# Patient Record
Sex: Female | Born: 1965 | Race: White | Hispanic: No | Smoking: Never smoker
Health system: Southern US, Community
[De-identification: ages and names within clinical notes are randomized; demographics above are authoritative.]

## PROBLEM LIST (undated history)

## (undated) DIAGNOSIS — Z8249 Family history of ischemic heart disease and other diseases of the circulatory system: Secondary | ICD-10-CM

## (undated) DIAGNOSIS — K449 Diaphragmatic hernia without obstruction or gangrene: Secondary | ICD-10-CM

## (undated) DIAGNOSIS — E039 Hypothyroidism, unspecified: Secondary | ICD-10-CM

## (undated) DIAGNOSIS — Z6837 Body mass index (BMI) 37.0-37.9, adult: Secondary | ICD-10-CM

## (undated) DIAGNOSIS — R635 Abnormal weight gain: Secondary | ICD-10-CM

## (undated) DIAGNOSIS — K219 Gastro-esophageal reflux disease without esophagitis: Secondary | ICD-10-CM

## (undated) DIAGNOSIS — I10 Essential (primary) hypertension: Secondary | ICD-10-CM

## (undated) HISTORY — DX: Abnormal weight gain: R63.5

## (undated) HISTORY — DX: Gastro-esophageal reflux disease without esophagitis: K21.9

## (undated) HISTORY — DX: Essential (primary) hypertension: I10

## (undated) HISTORY — DX: Hypothyroidism, unspecified: E03.9

## (undated) HISTORY — DX: Body mass index (BMI) 37.0-37.9, adult: Z68.37

## (undated) HISTORY — PX: NO PAST SURGERIES: SHX2092

## (undated) HISTORY — DX: Family history of ischemic heart disease and other diseases of the circulatory system: Z82.49

## (undated) HISTORY — DX: Gastro-esophageal reflux disease without esophagitis: K44.9

---

## 2019-09-19 DIAGNOSIS — R635 Abnormal weight gain: Secondary | ICD-10-CM | POA: Insufficient documentation

## 2019-09-19 DIAGNOSIS — I10 Essential (primary) hypertension: Secondary | ICD-10-CM | POA: Insufficient documentation

## 2019-09-19 DIAGNOSIS — K219 Gastro-esophageal reflux disease without esophagitis: Secondary | ICD-10-CM | POA: Insufficient documentation

## 2019-09-19 DIAGNOSIS — Z8249 Family history of ischemic heart disease and other diseases of the circulatory system: Secondary | ICD-10-CM | POA: Insufficient documentation

## 2019-09-19 DIAGNOSIS — E039 Hypothyroidism, unspecified: Secondary | ICD-10-CM | POA: Insufficient documentation

## 2019-09-19 DIAGNOSIS — Z6837 Body mass index (BMI) 37.0-37.9, adult: Secondary | ICD-10-CM | POA: Insufficient documentation

## 2019-09-24 NOTE — Progress Notes (Signed)
Cardiology Office Note:    Date:  09/25/2019   ID:  Melody Phillips, DOB 1965/10/04, MRN [id number]  PCP:  Buckner Malta, MD  Cardiologist:  Norman Herrlich, MD   Referring MD: Buckner Malta, MD  ASSESSMENT:    1. Cardiac risk counseling   2. Family history of heart disease   3. Essential hypertension    PLAN:    In order of problems listed above:  1. Although her 10-year risk is low and does not factor in family history, we decided further evaluation with a coronary CT calcium score if intermediate would justify statins and echocardiogram to exclude cardial myopathy with family history of sudden cardiac death in her mother's family. 2. Continue current antihypertensives.  Next appointment as needed if her calcium score is low and echo is normal   Medication Adjustments/Labs and Tests Ordered: Current medicines are reviewed at length with the patient today.  Concerns regarding medicines are outlined above.  Orders Placed This Encounter  Procedures  . CT CARDIAC SCORING  . EKG 12-Lead  . ECHOCARDIOGRAM COMPLETE   No orders of the defined types were placed in this encounter.    Chief Complaint  Patient presents with  . Heart Problem    Concerned about risk of heart disease with family history.    History of Present Illness:    Melody Phillips is a 54 y.o. female who is being seen today for the evaluation due to family history of CAD at the request of Buckner Malta, MD. She has a history of hypertension hypothyroidism.  Recent labs performed 07/31/2019 shows a hemoglobin of 12.6 CBC normal creatinine 0.7 GFR greater than 60 cc m3.9 remainder of CMP was normal free T4 normal TSH normal A1c 4.7%.  Her lipid profile shows a total cholesterol 141 LDL 68 triglycerides 124 HDL 53.  I reviewed her primary care physician office record from 07/31/2019 and 09/04/2019.  She has developed hypertension controlled on a 3 drug regimen amlodipine thiazide diuretic ARB.  Her lipids are  ideal.  Non-smoker.  Her family history is noteworthy for her mother dying of sudden cardiac death in her 31s maternal aunt died unexpectedly age 10 no autopsy and maternal aunt died at age 60 sudden cardiac death.  She is concerned about her cardiovascular risk.  She has had no syncope chest pain shortness of breath but at times has palpitation and frequent isolated not severe.  Her 10-year risk is 2%.  We discussed further tools and with her family history and sudden cardiac death echocardiogram is indicated regarding cardiomyopathy and a coronary calcium score to recalculate her risk.  If her risk is high she would benefit from further evaluation and statin therapy.  In the interim I encouraged antihypertensive treatment and starting low-dose aspirin 81 mg a day Past Medical History:  Diagnosis Date  . Abnormal weight gain   . BMI 37.0-37.9, adult   . Family history of cardiovascular disease   . Hiatal hernia with GERD without esophagitis   . Hypertension   . Hypothyroidism     Past Surgical History:  Procedure Laterality Date  . NO PAST SURGERIES      Current Medications: Current Meds  Medication Sig  . acyclovir (ZOVIRAX) 400 MG tablet Take 400 mg by mouth daily.  Marland Kitchen amLODipine (NORVASC) 5 MG tablet Take 5 mg by mouth daily.  . Biotin 45625 MCG TABS Take 10,000 mg by mouth daily.  . Echinacea 450 MG CAPS Take 450 mg by mouth daily.  Marland Kitchen  fluticasone (FLONASE) 50 MCG/ACT nasal spray Place 2 sprays into both nostrils daily.  . hydrochlorothiazide (HYDRODIURIL) 25 MG tablet Take 25 mg by mouth daily.   Marland Kitchen L-Lysine 1000 MG TABS Take 1,000 mg by mouth daily.  Marland Kitchen levothyroxine (SYNTHROID) 50 MCG tablet Take 50 mcg by mouth daily.  Marland Kitchen losartan (COZAAR) 100 MG tablet Take 100 mg by mouth daily.  . Misc Natural Products (ESTROVEN ENERGY PO) Take 1 tablet by mouth as needed.  . mupirocin ointment (BACTROBAN) 2 % Apply 1 application topically 2 (two) times daily.  Marland Kitchen omeprazole (PRILOSEC) 20 MG  capsule Take 20 mg by mouth daily.  Marland Kitchen topiramate (TOPAMAX) 25 MG tablet Take 25 mg by mouth 2 (two) times daily.  Marland Kitchen venlafaxine XR (EFFEXOR-XR) 75 MG 24 hr capsule Take 75 mg by mouth daily with breakfast.  . [DISCONTINUED] buPROPion (WELLBUTRIN XL) 150 MG 24 hr tablet Take 150 mg by mouth daily.     Allergies:   Patient has no known allergies.   Social History   Socioeconomic History  . Marital status: Divorced    Spouse name: Not on file  . Number of children: Not on file  . Years of education: Not on file  . Highest education level: Not on file  Occupational History  . Not on file  Tobacco Use  . Smoking status: Never Smoker  . Smokeless tobacco: Never Used  Substance and Sexual Activity  . Alcohol use: Never  . Drug use: Never  . Sexual activity: Not on file  Other Topics Concern  . Not on file  Social History Narrative  . Not on file   Social Determinants of Health   Financial Resource Strain:   . Difficulty of Paying Living Expenses: Not on file  Food Insecurity:   . Worried About Programme researcher, broadcasting/film/video in the Last Year: Not on file  . Ran Out of Food in the Last Year: Not on file  Transportation Needs:   . Lack of Transportation (Medical): Not on file  . Lack of Transportation (Non-Medical): Not on file  Physical Activity:   . Days of Exercise per Week: Not on file  . Minutes of Exercise per Session: Not on file  Stress:   . Feeling of Stress : Not on file  Social Connections:   . Frequency of Communication with Friends and Family: Not on file  . Frequency of Social Gatherings with Friends and Family: Not on file  . Attends Religious Services: Not on file  . Active Member of Clubs or Organizations: Not on file  . Attends Banker Meetings: Not on file  . Marital Status: Not on file     Family History: The patient's family history includes Breast cancer in her mother; COPD in her father; Heart attack in her maternal aunt and maternal aunt; Heart  disease in her maternal grandfather; Heart failure in her mother; Hyperlipidemia in her mother; Hypertension in her maternal aunt, maternal aunt, maternal grandfather, maternal grandmother, and mother; Stroke in her maternal grandmother.  ROS:   ROS Please see the history of present illness.     All other systems reviewed and are negative.  EKGs/Labs/Other Studies Reviewed:    The following studies were reviewed today:   EKG:  EKG is  ordered today.  The ekg ordered today is personally reviewed and demonstrates less tachycardia 105 bpm otherwise normal   Physical Exam:    VS:  BP 128/70   Pulse (!) 105   Ht  5' 4.5" (1.638 m)   Wt 215 lb (97.5 kg)   SpO2 97%   BMI 36.33 kg/m     Wt Readings from Last 3 Encounters:  09/25/19 215 lb (97.5 kg)     GEN:  Well nourished, well developed in no acute distress HEENT: Normal NECK: No JVD; No carotid bruits LYMPHATICS: No lymphadenopathy CARDIAC: RRR, no murmurs, rubs, gallops RESPIRATORY:  Clear to auscultation without rales, wheezing or rhonchi  ABDOMEN: Soft, non-tender, non-distended MUSCULOSKELETAL:  No edema; No deformity  SKIN: Warm and dry NEUROLOGIC:  Alert and oriented x 3 PSYCHIATRIC:  Normal affect     Signed, Norman Herrlich, MD  09/25/2019 4:13 PM    [facility]

## 2019-09-25 ENCOUNTER — Other Ambulatory Visit: Payer: Self-pay

## 2019-09-25 ENCOUNTER — Encounter: Payer: Self-pay | Admitting: Cardiology

## 2019-09-25 ENCOUNTER — Ambulatory Visit (INDEPENDENT_AMBULATORY_CARE_PROVIDER_SITE_OTHER): Payer: 59 | Admitting: Cardiology

## 2019-09-25 VITALS — BP 128/70 | HR 105 | Ht 64.5 in | Wt 215.0 lb

## 2019-09-25 DIAGNOSIS — Z7189 Other specified counseling: Secondary | ICD-10-CM

## 2019-09-25 DIAGNOSIS — I10 Essential (primary) hypertension: Secondary | ICD-10-CM | POA: Diagnosis not present

## 2019-09-25 DIAGNOSIS — Z8249 Family history of ischemic heart disease and other diseases of the circulatory system: Secondary | ICD-10-CM

## 2019-09-25 MED ORDER — ASPIRIN EC 81 MG PO TBEC: 81.0000 mg | DELAYED_RELEASE_TABLET | Freq: Every day | ORAL | 3 refills | Status: AC

## 2019-09-25 NOTE — Patient Instructions (Addendum)
Medication Instructions:  Your physician has recommended you make the following change in your medication:  START: Aspirin 81 mg take one tablet by mouth daily *If you need a refill on your cardiac medications before your next appointment, please call your pharmacy*   Lab Work: None If you have labs (blood work) drawn today and your tests are completely normal, you will receive your results only by: Marland Kitchen MyChart Message (if you have MyChart) OR . A paper copy in the mail If you have any lab test that is abnormal or we need to change your treatment, we will call you to review the results.   Testing/Procedures: Your physician has requested that you have an echocardiogram. Echocardiography is a painless test that uses sound waves to create images of your heart. It provides your doctor with information about the size and shape of your heart and how well your heart's chambers and valves are working. This procedure takes approximately one hour. There are no restrictions for this procedure.  We have put in an order for you to have a cardiac calcium score completed. They will call you to schedule this appointment.    Follow-Up: At [facility], you and your health needs are our priority.  As part of our continuing mission to provide you with exceptional heart care, we have created designated Provider Care Teams.  These Care Teams include your primary Cardiologist (physician) and Advanced Practice Providers (APPs -  Physician Assistants and Nurse Practitioners) who all work together to provide you with the care you need, when you need it.  We recommend signing up for the patient portal called "MyChart".  Sign up information is provided on this After Visit Summary.  MyChart is used to connect with patients for Virtual Visits (Telemedicine).  Patients are able to view lab/test results, encounter notes, upcoming appointments, etc.  Non-urgent messages can be sent to your provider as well.   To learn more  about what you can do with MyChart, go to [email or web address].    Your next appointment:   As needed  The format for your next appointment:   In Person  Provider:   Norman Herrlich, MD   Other Instructions

## 2019-10-09 ENCOUNTER — Ambulatory Visit (INDEPENDENT_AMBULATORY_CARE_PROVIDER_SITE_OTHER)
Admission: RE | Admit: 2019-10-09 | Discharge: 2019-10-09 | Disposition: A | Discharge status: Home / Self Care | Payer: Self-pay | Source: Ambulatory Visit | Attending: Cardiology | Admitting: Cardiology

## 2019-10-09 ENCOUNTER — Other Ambulatory Visit: Payer: Self-pay

## 2019-10-09 DIAGNOSIS — Z7189 Other specified counseling: Secondary | ICD-10-CM

## 2019-10-09 DIAGNOSIS — Z8249 Family history of ischemic heart disease and other diseases of the circulatory system: Secondary | ICD-10-CM

## 2019-10-10 ENCOUNTER — Telehealth: Payer: Self-pay | Admitting: Cardiology

## 2019-10-10 NOTE — Telephone Encounter (Signed)
Left message for patient to return call.

## 2019-10-10 NOTE — Telephone Encounter (Signed)
Spoke with patient regarding results and recommendation.  Patient verbalizes understanding and is agreeable to plan of care. Advised patient to call back with any issues or concerns.  

## 2019-10-10 NOTE — Telephone Encounter (Signed)
Pt is returning call.  

## 2019-10-10 NOTE — Telephone Encounter (Signed)
Patient is returning Hayley's call in regards to CT results.

## 2019-10-18 ENCOUNTER — Ambulatory Visit (INDEPENDENT_AMBULATORY_CARE_PROVIDER_SITE_OTHER): Payer: 59

## 2019-10-18 ENCOUNTER — Other Ambulatory Visit: Payer: Self-pay

## 2019-10-18 DIAGNOSIS — I10 Essential (primary) hypertension: Secondary | ICD-10-CM

## 2019-10-18 DIAGNOSIS — Z8249 Family history of ischemic heart disease and other diseases of the circulatory system: Secondary | ICD-10-CM

## 2019-10-18 DIAGNOSIS — Z7189 Other specified counseling: Secondary | ICD-10-CM | POA: Diagnosis not present

## 2019-10-18 LAB — ECHOCARDIOGRAM COMPLETE
Area-P 1/2: 3.74 cm2
P 1/2 time: 480 msec
S' Lateral: 3.4 cm

## 2019-10-18 NOTE — Progress Notes (Signed)
Complete echocardiogram performed.  Jimmy Mercedees Convery RDCS, RVT  

## 2019-10-19 ENCOUNTER — Telehealth: Payer: Self-pay

## 2019-10-19 NOTE — Telephone Encounter (Signed)
Spoke with patient regarding results and recommendation.  Patient verbalizes understanding and is agreeable to plan of care. Advised patient to call back with any issues or concerns.  

## 2019-10-19 NOTE — Telephone Encounter (Signed)
-----   Message from Baldo Daub, MD sent at 10/18/2019  5:11 PM EDT ----- Good result normal

## 2021-06-24 ENCOUNTER — Other Ambulatory Visit (HOSPITAL_BASED_OUTPATIENT_CLINIC_OR_DEPARTMENT_OTHER): Payer: Self-pay

## 2021-06-24 MED ORDER — ACYCLOVIR 400 MG PO TABS
400.0000 mg | ORAL_TABLET | Freq: Every day | ORAL | 0 refills | Status: DC
  Filled 2021-06-24 – 2021-08-11 (×4): qty 90, 90d supply, fill #0

## 2021-06-24 MED ORDER — LEVOTHYROXINE SODIUM 50 MCG PO TABS
50.0000 ug | ORAL_TABLET | Freq: Every day | ORAL | 0 refills | Status: DC
  Filled 2021-06-24: qty 90, 90d supply, fill #0

## 2021-06-24 MED ORDER — TOPIRAMATE 25 MG PO TABS
25.0000 mg | ORAL_TABLET | Freq: Two times a day (BID) | ORAL | 0 refills | Status: DC
  Filled 2021-06-24: qty 180, 90d supply, fill #0

## 2021-06-24 MED ORDER — OMEPRAZOLE 20 MG PO CPDR
20.0000 mg | DELAYED_RELEASE_CAPSULE | Freq: Every day | ORAL | 0 refills | Status: DC
  Filled 2021-06-24: qty 90, 90d supply, fill #0

## 2021-06-24 MED ORDER — LOSARTAN POTASSIUM 100 MG PO TABS
100.0000 mg | ORAL_TABLET | Freq: Every day | ORAL | 0 refills | Status: DC
  Filled 2021-06-24: qty 90, 90d supply, fill #0

## 2021-06-24 MED ORDER — VENLAFAXINE HCL ER 75 MG PO CP24
75.0000 mg | ORAL_CAPSULE | Freq: Every day | ORAL | 0 refills | Status: DC
  Filled 2021-06-24: qty 90, 90d supply, fill #0

## 2021-06-24 MED ORDER — HYDROCHLOROTHIAZIDE 25 MG PO TABS
25.0000 mg | ORAL_TABLET | Freq: Every day | ORAL | 0 refills | Status: DC
  Filled 2021-06-24: qty 90, 90d supply, fill #0

## 2021-06-24 MED ORDER — AMLODIPINE BESYLATE 5 MG PO TABS
5.0000 mg | ORAL_TABLET | Freq: Every day | ORAL | 0 refills | Status: DC
  Filled 2021-06-24: qty 90, 90d supply, fill #0

## 2021-06-25 ENCOUNTER — Other Ambulatory Visit (HOSPITAL_BASED_OUTPATIENT_CLINIC_OR_DEPARTMENT_OTHER): Payer: Self-pay

## 2021-07-03 ENCOUNTER — Other Ambulatory Visit (HOSPITAL_BASED_OUTPATIENT_CLINIC_OR_DEPARTMENT_OTHER): Payer: Self-pay

## 2021-07-24 ENCOUNTER — Other Ambulatory Visit (HOSPITAL_BASED_OUTPATIENT_CLINIC_OR_DEPARTMENT_OTHER): Payer: Self-pay

## 2021-08-11 ENCOUNTER — Other Ambulatory Visit (HOSPITAL_BASED_OUTPATIENT_CLINIC_OR_DEPARTMENT_OTHER): Payer: Self-pay

## 2021-10-29 ENCOUNTER — Other Ambulatory Visit (HOSPITAL_BASED_OUTPATIENT_CLINIC_OR_DEPARTMENT_OTHER): Payer: Self-pay

## 2021-10-29 MED ORDER — HYDROCHLOROTHIAZIDE 25 MG PO TABS
25.0000 mg | ORAL_TABLET | Freq: Every day | ORAL | 1 refills | Status: DC
  Filled 2021-10-29: qty 90, 90d supply, fill #0
  Filled 2022-02-04: qty 90, 90d supply, fill #1

## 2021-10-29 MED ORDER — ACYCLOVIR 400 MG PO TABS
400.0000 mg | ORAL_TABLET | Freq: Every day | ORAL | 1 refills | Status: DC
  Filled 2021-10-29: qty 90, 90d supply, fill #0
  Filled 2022-02-04: qty 90, 90d supply, fill #1

## 2021-10-29 MED ORDER — AMLODIPINE BESYLATE 5 MG PO TABS
5.0000 mg | ORAL_TABLET | Freq: Every day | ORAL | 1 refills | Status: DC
  Filled 2021-10-29: qty 90, 90d supply, fill #0
  Filled 2022-02-04: qty 90, 90d supply, fill #1

## 2021-10-29 MED ORDER — OMEPRAZOLE 20 MG PO CPDR
20.0000 mg | DELAYED_RELEASE_CAPSULE | Freq: Every day | ORAL | 0 refills | Status: DC
  Filled 2021-10-29: qty 90, 90d supply, fill #0

## 2021-10-29 MED ORDER — VENLAFAXINE HCL ER 75 MG PO CP24
75.0000 mg | ORAL_CAPSULE | Freq: Every day | ORAL | 1 refills | Status: DC
  Filled 2021-10-29: qty 90, 90d supply, fill #0
  Filled 2022-02-04: qty 90, 90d supply, fill #1

## 2021-10-29 MED ORDER — LOSARTAN POTASSIUM 100 MG PO TABS
100.0000 mg | ORAL_TABLET | Freq: Every day | ORAL | 1 refills | Status: DC
  Filled 2021-10-29: qty 90, 90d supply, fill #0
  Filled 2022-02-04: qty 90, 90d supply, fill #1

## 2021-10-29 MED ORDER — LEVOTHYROXINE SODIUM 50 MCG PO TABS
50.0000 ug | ORAL_TABLET | Freq: Every day | ORAL | 1 refills | Status: DC
  Filled 2021-10-29: qty 90, 90d supply, fill #0
  Filled 2022-02-04: qty 90, 90d supply, fill #1

## 2021-10-30 ENCOUNTER — Other Ambulatory Visit (HOSPITAL_BASED_OUTPATIENT_CLINIC_OR_DEPARTMENT_OTHER): Payer: Self-pay

## 2021-11-02 ENCOUNTER — Other Ambulatory Visit (HOSPITAL_BASED_OUTPATIENT_CLINIC_OR_DEPARTMENT_OTHER): Payer: Self-pay

## 2021-11-04 ENCOUNTER — Other Ambulatory Visit (HOSPITAL_BASED_OUTPATIENT_CLINIC_OR_DEPARTMENT_OTHER): Payer: Self-pay

## 2022-02-04 ENCOUNTER — Other Ambulatory Visit (HOSPITAL_BASED_OUTPATIENT_CLINIC_OR_DEPARTMENT_OTHER): Payer: Self-pay

## 2022-02-04 MED ORDER — OMEPRAZOLE 20 MG PO CPDR
20.0000 mg | DELAYED_RELEASE_CAPSULE | Freq: Every day | ORAL | 0 refills | Status: DC
  Filled 2022-02-04: qty 90, 90d supply, fill #0

## 2022-04-28 ENCOUNTER — Other Ambulatory Visit (HOSPITAL_BASED_OUTPATIENT_CLINIC_OR_DEPARTMENT_OTHER): Payer: Self-pay

## 2022-04-28 MED ORDER — OMEPRAZOLE 20 MG PO CPDR
20.0000 mg | DELAYED_RELEASE_CAPSULE | Freq: Every day | ORAL | 1 refills | Status: DC
  Filled 2022-04-28 – 2022-05-10 (×2): qty 90, 90d supply, fill #0
  Filled 2022-08-02: qty 90, 90d supply, fill #1

## 2022-04-28 MED ORDER — VENLAFAXINE HCL ER 75 MG PO CP24
75.0000 mg | ORAL_CAPSULE | Freq: Every day | ORAL | 1 refills | Status: DC
  Filled 2022-04-28 – 2022-05-10 (×2): qty 90, 90d supply, fill #0
  Filled 2022-08-02: qty 90, 90d supply, fill #1

## 2022-04-28 MED ORDER — LEVOTHYROXINE SODIUM 50 MCG PO TABS
50.0000 ug | ORAL_TABLET | Freq: Every day | ORAL | 1 refills | Status: DC
  Filled 2022-04-28 – 2022-05-10 (×2): qty 90, 90d supply, fill #0
  Filled 2022-08-02: qty 90, 90d supply, fill #1

## 2022-04-28 MED ORDER — HYDROCHLOROTHIAZIDE 25 MG PO TABS
25.0000 mg | ORAL_TABLET | Freq: Every day | ORAL | 1 refills | Status: DC
  Filled 2022-04-28 – 2022-05-10 (×2): qty 90, 90d supply, fill #0

## 2022-04-28 MED ORDER — LOSARTAN POTASSIUM 100 MG PO TABS
100.0000 mg | ORAL_TABLET | Freq: Every day | ORAL | 1 refills | Status: DC
  Filled 2022-05-10: qty 90, 90d supply, fill #0

## 2022-04-28 MED ORDER — AMLODIPINE BESYLATE 5 MG PO TABS
5.0000 mg | ORAL_TABLET | Freq: Every day | ORAL | 1 refills | Status: DC
  Filled 2022-04-28 – 2022-05-10 (×2): qty 90, 90d supply, fill #0

## 2022-04-30 ENCOUNTER — Other Ambulatory Visit (HOSPITAL_BASED_OUTPATIENT_CLINIC_OR_DEPARTMENT_OTHER): Payer: Self-pay

## 2022-05-10 ENCOUNTER — Other Ambulatory Visit: Payer: Self-pay

## 2022-05-10 ENCOUNTER — Other Ambulatory Visit (HOSPITAL_BASED_OUTPATIENT_CLINIC_OR_DEPARTMENT_OTHER): Payer: Self-pay

## 2022-05-14 ENCOUNTER — Other Ambulatory Visit (HOSPITAL_BASED_OUTPATIENT_CLINIC_OR_DEPARTMENT_OTHER): Payer: Self-pay

## 2022-05-21 ENCOUNTER — Other Ambulatory Visit (HOSPITAL_BASED_OUTPATIENT_CLINIC_OR_DEPARTMENT_OTHER): Payer: Self-pay

## 2022-05-21 MED ORDER — ACYCLOVIR 400 MG PO TABS
400.0000 mg | ORAL_TABLET | Freq: Every day | ORAL | 1 refills | Status: DC
  Filled 2022-05-21: qty 90, 90d supply, fill #0
  Filled 2022-08-02: qty 90, 90d supply, fill #1

## 2022-06-06 IMAGING — CT CT CARDIAC CORONARY ARTERY CALCIUM SCORE
3 series · 14 of 20 positions shown, 15 images · non-contrast
Comparison: None.
COMPARISON: None.

Addendum:
EXAM:
OVER-READ INTERPRETATION  CT CHEST

The following report is an over-read performed by radiologist Dr.
Olga Ljubisa Nbgd [REDACTED] on 10/09/2019. This over-read
does not include interpretation of cardiac or coronary anatomy or
pathology. The calcium score interpretation by the cardiologist is
attached.
CLINICAL DATA: Risk stratification
Coronary Calcium Score
TECHNIQUE: The patient was scanned on a Siemens Force scanner. Axial
non-contrast 3 mm slices were carried out through the heart. The
data set was analyzed on a dedicated work station and scored using
the Agatson method.

[Series 2: casc 3.0 bv41 2 bestsyst 39 % · axial · 0.34mm/px · z∈[-248,-170]mm · 4 of 44 slices shown, 5 images]
[im 9/44  vessel]
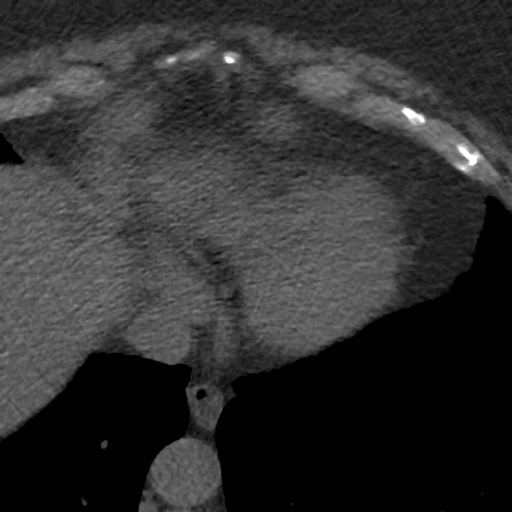
[im 9/44  lung]
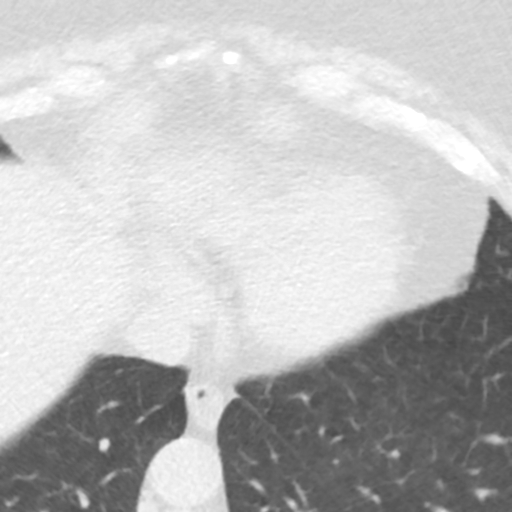
[im 18/44  vessel]
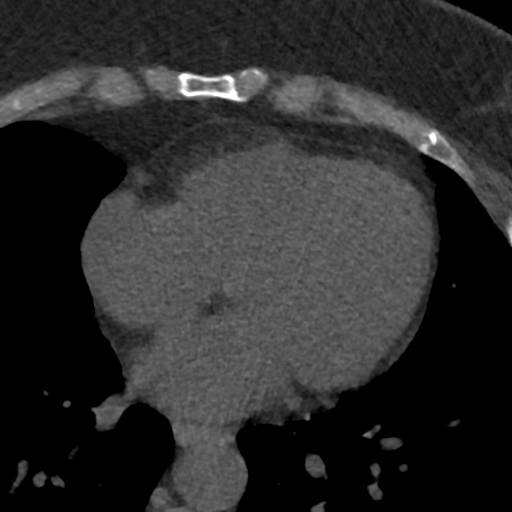
[im 26/44  vessel]
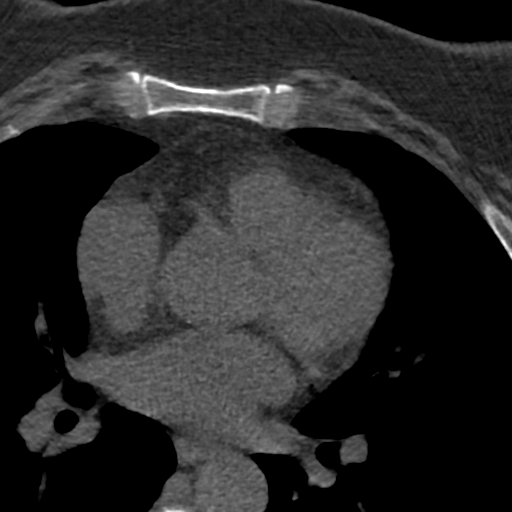
[im 35/44  vessel]
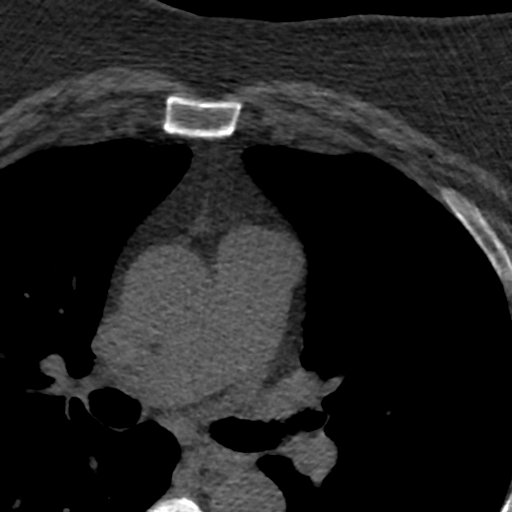

[Series 3: lung 40 % · axial · 0.66mm/px · z∈[-251,-167]mm · 5 of 44 slices shown]
[im 8/44  lung]
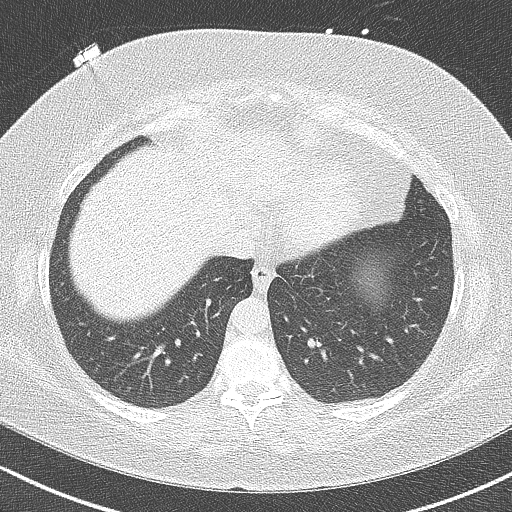
[im 15/44  lung]
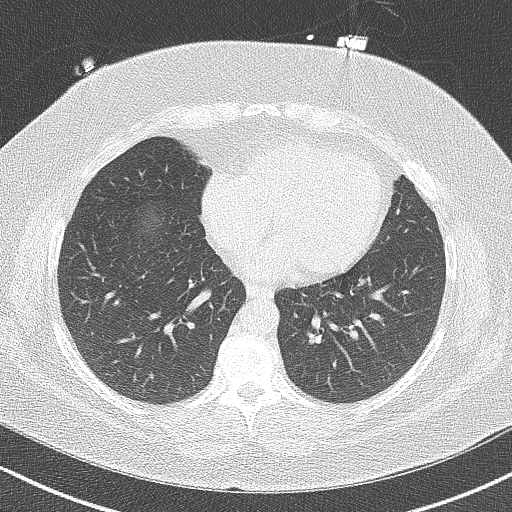
[im 22/44  lung]
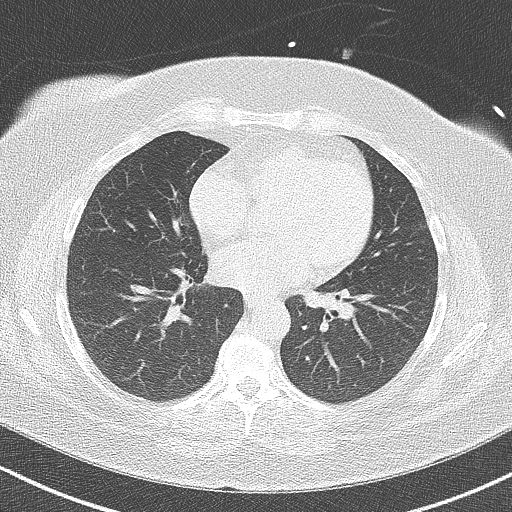
[im 29/44  lung]
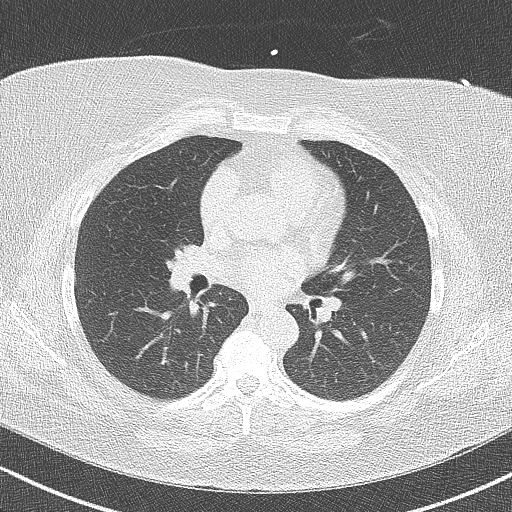
[im 36/44  lung]
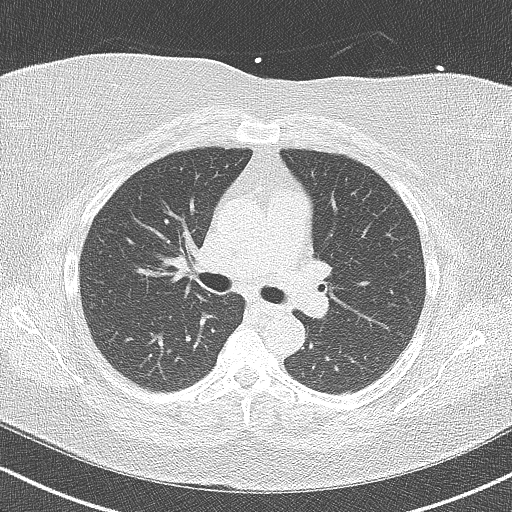

[Series 4: lung st 40 % · axial · 0.66mm/px · z∈[-251,-167]mm · 5 of 44 slices shown]
[im 8/44  lung]
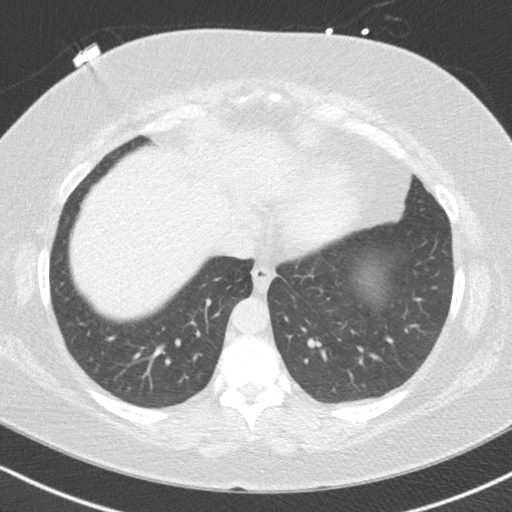
[im 15/44  lung]
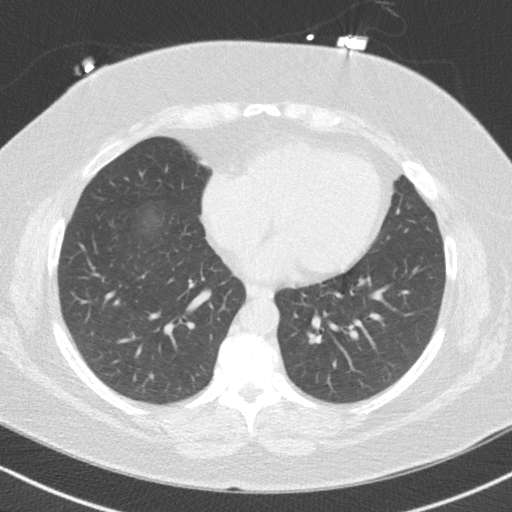
[im 22/44  lung]
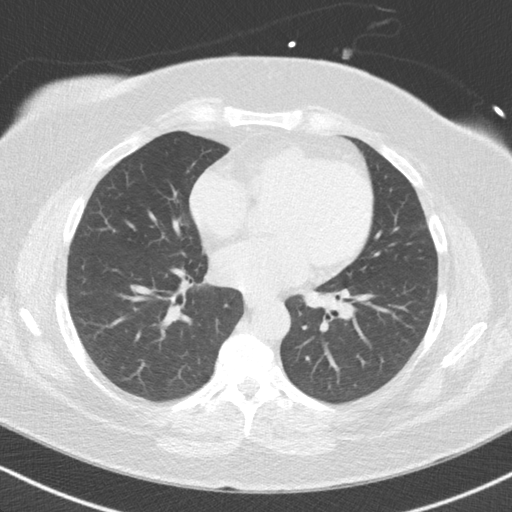
[im 29/44  lung]
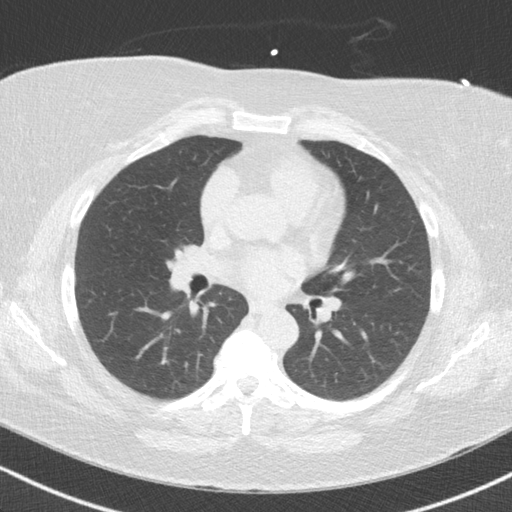
[im 36/44  lung]
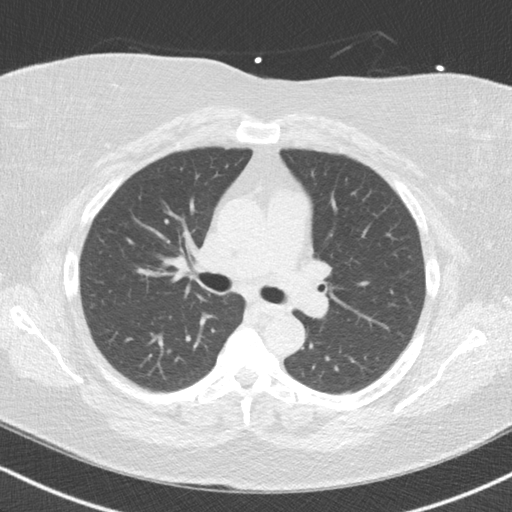

[14 of 20 positions shown; findings below may reference images not displayed]

FINDINGS: Vascular: Normal aortic caliber.

Mediastinum/Nodes: No imaged thoracic adenopathy.

Lungs/Pleura: No pleural fluid. Left lower lobe 4 mm pulmonary
nodule on [DATE].

Upper Abdomen: Mild hepatic steatosis. Normal imaged portions of the
spleen, stomach.

Musculoskeletal: No acute osseous abnormality.
IMPRESSION: 1.  No acute findings in the imaged extracardiac chest.
2. Left lower lobe 4 mm pulmonary nodule. No follow-up needed if
patient is low-risk. Non-contrast chest CT can be considered in 12
months if patient is high-risk. This recommendation follows the
consensus statement: Guidelines for Management of Incidental
Pulmonary Nodules Detected on CT Images: From the [HOSPITAL]
3. Mild hepatic steatosis.
FINDINGS: Non-cardiac: See separate report from [REDACTED].

Ascending Aorta: Normal

Pericardium: Normal

Coronary arteries: Normal origin
IMPRESSION: Coronary calcium score of 0 . This was 0 percentile for age and sex
matched control.

Nomasibulele Moatshe,DO

*** End of Addendum ***
EXAM:
OVER-READ INTERPRETATION  CT CHEST

The following report is an over-read performed by radiologist Dr.
Olga Ljubisa Nbgd [REDACTED] on 10/09/2019. This over-read
does not include interpretation of cardiac or coronary anatomy or
pathology. The calcium score interpretation by the cardiologist is
attached.
FINDINGS: Vascular: Normal aortic caliber.

Mediastinum/Nodes: No imaged thoracic adenopathy.

Lungs/Pleura: No pleural fluid. Left lower lobe 4 mm pulmonary
nodule on [DATE].

Upper Abdomen: Mild hepatic steatosis. Normal imaged portions of the
spleen, stomach.

Musculoskeletal: No acute osseous abnormality.
IMPRESSION: 1.  No acute findings in the imaged extracardiac chest.
2. Left lower lobe 4 mm pulmonary nodule. No follow-up needed if
patient is low-risk. Non-contrast chest CT can be considered in 12
months if patient is high-risk. This recommendation follows the
consensus statement: Guidelines for Management of Incidental
Pulmonary Nodules Detected on CT Images: From the [HOSPITAL]
3. Mild hepatic steatosis.

## 2022-07-29 ENCOUNTER — Other Ambulatory Visit (HOSPITAL_BASED_OUTPATIENT_CLINIC_OR_DEPARTMENT_OTHER): Payer: Self-pay

## 2022-07-29 DIAGNOSIS — I10 Essential (primary) hypertension: Secondary | ICD-10-CM | POA: Diagnosis not present

## 2022-07-29 DIAGNOSIS — N951 Menopausal and female climacteric states: Secondary | ICD-10-CM | POA: Diagnosis not present

## 2022-07-29 DIAGNOSIS — Z6838 Body mass index (BMI) 38.0-38.9, adult: Secondary | ICD-10-CM | POA: Diagnosis not present

## 2022-07-29 DIAGNOSIS — E039 Hypothyroidism, unspecified: Secondary | ICD-10-CM | POA: Diagnosis not present

## 2022-07-29 MED ORDER — AMLODIPINE BESYLATE 5 MG PO TABS
5.0000 mg | ORAL_TABLET | Freq: Every day | ORAL | 1 refills | Status: DC
  Filled 2022-07-29: qty 90, 90d supply, fill #0
  Filled 2022-11-01: qty 90, 90d supply, fill #1

## 2022-07-29 MED ORDER — HYDROCHLOROTHIAZIDE 25 MG PO TABS
25.0000 mg | ORAL_TABLET | Freq: Every day | ORAL | 1 refills | Status: DC
  Filled 2022-07-29: qty 90, 90d supply, fill #0
  Filled 2022-11-01: qty 90, 90d supply, fill #1

## 2022-07-29 MED ORDER — LOSARTAN POTASSIUM 100 MG PO TABS
100.0000 mg | ORAL_TABLET | Freq: Every day | ORAL | 1 refills | Status: DC
  Filled 2022-07-29: qty 90, 90d supply, fill #0
  Filled 2022-11-01: qty 90, 90d supply, fill #1

## 2022-08-02 ENCOUNTER — Other Ambulatory Visit (HOSPITAL_BASED_OUTPATIENT_CLINIC_OR_DEPARTMENT_OTHER): Payer: Self-pay

## 2022-08-09 ENCOUNTER — Other Ambulatory Visit (HOSPITAL_BASED_OUTPATIENT_CLINIC_OR_DEPARTMENT_OTHER): Payer: Self-pay

## 2022-08-09 MED ORDER — LEVOTHYROXINE SODIUM 50 MCG PO TABS
50.0000 ug | ORAL_TABLET | Freq: Every day | ORAL | 1 refills | Status: DC
  Filled 2022-11-01: qty 90, 90d supply, fill #0
  Filled 2023-01-28: qty 90, 90d supply, fill #1

## 2022-08-11 ENCOUNTER — Telehealth: Payer: Commercial Managed Care - PPO | Admitting: Nurse Practitioner

## 2022-08-11 DIAGNOSIS — M545 Low back pain, unspecified: Secondary | ICD-10-CM

## 2022-08-11 MED ORDER — PREDNISONE 20 MG PO TABS: 40.0000 mg | ORAL_TABLET | Freq: Every day | ORAL | 0 refills | Status: AC

## 2022-08-11 NOTE — Addendum Note (Signed)
Addended by: Reynald Woods, MARY-MARGARET on: 08/11/2022 01:11 PM   Modules accepted: Level of Service  

## 2022-08-11 NOTE — Progress Notes (Signed)
We are sorry that you are not feeling well.  Here is how we plan to help!  Based on what you have shared with me it looks like you mostly have acute back pain.  Acute back pain is defined as musculoskeletal pain that can resolve in 1-3 weeks with conservative treatment.  I have prescribed  prednisone 20mg  2 tablets at the same time daily for 5 days.  Some patients experience stomach irritation or in increased heartburn with anti-inflammatory drugs.  Please keep in mind that muscle relaxer's can cause fatigue and should not be taken while at work or driving.  Back pain is very common.  The pain often gets better over time.  The cause of back pain is usually not dangerous.  Most people can learn to manage their back pain on their own.  Home Care Stay active.  Start with short walks on flat ground if you can.  Try to walk farther each day. Do not sit, drive or stand in one place for more than 30 minutes.  Do not stay in bed. Do not avoid exercise or work.  Activity can help your back heal faster. Be careful when you bend or lift an object.  Bend at your knees, keep the object close to you, and do not twist. Sleep on a firm mattress.  Lie on your side, and bend your knees.  If you lie on your back, put a pillow under your knees. Only take medicines as told by your doctor. Put ice on the injured area. Put ice in a plastic bag Place a towel between your skin and the bag Leave the ice on for 15-20 minutes, 3-4 times a day for the first 2-3 days. 210 After that, you can switch between ice and heat packs. Ask your doctor about back exercises or massage. Avoid feeling anxious or stressed.  Find good ways to deal with stress, such as exercise.  Get Help Right Way If: Your pain does not go away with rest or medicine. Your pain does not go away in 1 week. You have new problems. You do not feel well. The pain spreads into your legs. You cannot control when you poop (bowel movement) or pee  (urinate) You feel sick to your stomach (nauseous) or throw up (vomit) You have belly (abdominal) pain. You feel like you may pass out (faint). If you develop a fever.  Make Sure you: Understand these instructions. Will watch your condition Will get help right away if you are not doing well or get worse.  Your e-visit answers were reviewed by a board certified advanced clinical practitioner to complete your personal care plan.  Depending on the condition, your plan could have included both over the counter or prescription medications.  If there is a problem please reply  once you have received a response from your provider.  Your safety is important to [place].  If you have drug allergies check your prescription carefully.    You can use MyChart to ask questions about today's visit, request a non-urgent call back, or ask for a work or school excuse for 24 hours related to this e-Visit. If it has been greater than 24 hours you will need to follow up with your provider, or enter a new e-Visit to address those concerns.  You will get an e-mail in the next two days asking about your experience.  I hope that your e-visit has been valuable and will speed your recovery. Thank you for using e-visits.  Mary-Margaret Daphine Deutscher, FNP   5-10 minutes spent reviewing and documenting in chart.

## 2022-10-19 ENCOUNTER — Other Ambulatory Visit (HOSPITAL_BASED_OUTPATIENT_CLINIC_OR_DEPARTMENT_OTHER): Payer: Self-pay

## 2022-10-25 DIAGNOSIS — R92323 Mammographic fibroglandular density, bilateral breasts: Secondary | ICD-10-CM | POA: Diagnosis not present

## 2022-10-25 DIAGNOSIS — Z1231 Encounter for screening mammogram for malignant neoplasm of breast: Secondary | ICD-10-CM | POA: Diagnosis not present

## 2022-11-01 ENCOUNTER — Other Ambulatory Visit: Payer: Self-pay

## 2022-11-01 ENCOUNTER — Other Ambulatory Visit (HOSPITAL_BASED_OUTPATIENT_CLINIC_OR_DEPARTMENT_OTHER): Payer: Self-pay

## 2022-11-01 MED ORDER — OMEPRAZOLE 20 MG PO CPDR
20.0000 mg | DELAYED_RELEASE_CAPSULE | Freq: Every day | ORAL | 1 refills | Status: DC
  Filled 2022-11-01: qty 90, 90d supply, fill #0
  Filled 2023-01-28: qty 90, 90d supply, fill #1

## 2022-11-03 ENCOUNTER — Other Ambulatory Visit (HOSPITAL_BASED_OUTPATIENT_CLINIC_OR_DEPARTMENT_OTHER): Payer: Self-pay

## 2022-11-03 MED ORDER — ACYCLOVIR 400 MG PO TABS
400.0000 mg | ORAL_TABLET | Freq: Every day | ORAL | 1 refills | Status: DC
  Filled 2022-11-03: qty 90, 90d supply, fill #0
  Filled 2023-01-28: qty 90, 90d supply, fill #1

## 2022-11-03 MED ORDER — VENLAFAXINE HCL ER 75 MG PO CP24
75.0000 mg | ORAL_CAPSULE | Freq: Every day | ORAL | 1 refills | Status: DC
  Filled 2022-11-03: qty 90, 90d supply, fill #0
  Filled 2023-01-28: qty 90, 90d supply, fill #1

## 2022-11-08 ENCOUNTER — Other Ambulatory Visit (HOSPITAL_BASED_OUTPATIENT_CLINIC_OR_DEPARTMENT_OTHER): Payer: Self-pay

## 2022-11-08 MED ORDER — DICLOFENAC SODIUM 75 MG PO TBEC
75.0000 mg | DELAYED_RELEASE_TABLET | Freq: Every day | ORAL | 1 refills | Status: DC
  Filled 2022-11-08 – 2022-12-07 (×3): qty 30, 30d supply, fill #0
  Filled 2023-01-28: qty 30, 30d supply, fill #1

## 2022-11-10 ENCOUNTER — Other Ambulatory Visit (HOSPITAL_BASED_OUTPATIENT_CLINIC_OR_DEPARTMENT_OTHER): Payer: Self-pay

## 2022-11-10 MED ORDER — DICLOFENAC SODIUM 75 MG PO TBEC
75.0000 mg | DELAYED_RELEASE_TABLET | Freq: Every day | ORAL | 1 refills | Status: DC | PRN
  Filled 2022-11-10 – 2023-05-24 (×2): qty 30, 30d supply, fill #0
  Filled 2023-06-24: qty 30, 30d supply, fill #1

## 2022-11-19 ENCOUNTER — Other Ambulatory Visit (HOSPITAL_BASED_OUTPATIENT_CLINIC_OR_DEPARTMENT_OTHER): Payer: Self-pay

## 2022-11-22 ENCOUNTER — Other Ambulatory Visit (HOSPITAL_BASED_OUTPATIENT_CLINIC_OR_DEPARTMENT_OTHER): Payer: Self-pay

## 2022-11-22 ENCOUNTER — Other Ambulatory Visit: Payer: Self-pay

## 2022-12-01 ENCOUNTER — Other Ambulatory Visit (HOSPITAL_BASED_OUTPATIENT_CLINIC_OR_DEPARTMENT_OTHER): Payer: Self-pay

## 2022-12-07 ENCOUNTER — Other Ambulatory Visit (HOSPITAL_BASED_OUTPATIENT_CLINIC_OR_DEPARTMENT_OTHER): Payer: Self-pay

## 2023-01-28 ENCOUNTER — Other Ambulatory Visit: Payer: Self-pay

## 2023-01-28 ENCOUNTER — Other Ambulatory Visit (HOSPITAL_BASED_OUTPATIENT_CLINIC_OR_DEPARTMENT_OTHER): Payer: Self-pay

## 2023-01-28 MED ORDER — HYDROCHLOROTHIAZIDE 25 MG PO TABS
25.0000 mg | ORAL_TABLET | Freq: Every day | ORAL | 1 refills | Status: DC
  Filled 2023-01-28: qty 90, 90d supply, fill #0
  Filled 2023-04-26: qty 90, 90d supply, fill #1

## 2023-01-28 MED ORDER — LOSARTAN POTASSIUM 100 MG PO TABS
100.0000 mg | ORAL_TABLET | Freq: Every day | ORAL | 0 refills | Status: DC
  Filled 2023-01-28: qty 90, 90d supply, fill #0

## 2023-01-28 MED ORDER — AMLODIPINE BESYLATE 5 MG PO TABS
5.0000 mg | ORAL_TABLET | Freq: Every day | ORAL | 0 refills | Status: DC
  Filled 2023-01-28: qty 90, 90d supply, fill #0

## 2023-01-31 DIAGNOSIS — E039 Hypothyroidism, unspecified: Secondary | ICD-10-CM | POA: Diagnosis not present

## 2023-02-01 DIAGNOSIS — Z1331 Encounter for screening for depression: Secondary | ICD-10-CM | POA: Diagnosis not present

## 2023-02-01 DIAGNOSIS — E039 Hypothyroidism, unspecified: Secondary | ICD-10-CM | POA: Diagnosis not present

## 2023-02-01 DIAGNOSIS — Z131 Encounter for screening for diabetes mellitus: Secondary | ICD-10-CM | POA: Diagnosis not present

## 2023-02-01 DIAGNOSIS — Z0000 Encounter for general adult medical examination without abnormal findings: Secondary | ICD-10-CM | POA: Diagnosis not present

## 2023-02-01 DIAGNOSIS — Z1339 Encounter for screening examination for other mental health and behavioral disorders: Secondary | ICD-10-CM | POA: Diagnosis not present

## 2023-02-01 DIAGNOSIS — Z6835 Body mass index (BMI) 35.0-35.9, adult: Secondary | ICD-10-CM | POA: Diagnosis not present

## 2023-03-04 ENCOUNTER — Other Ambulatory Visit (HOSPITAL_BASED_OUTPATIENT_CLINIC_OR_DEPARTMENT_OTHER): Payer: Self-pay

## 2023-03-04 MED ORDER — DICLOFENAC SODIUM 75 MG PO TBEC
75.0000 mg | DELAYED_RELEASE_TABLET | Freq: Every day | ORAL | 1 refills | Status: DC | PRN
  Filled 2023-03-04: qty 30, 30d supply, fill #0
  Filled 2023-04-07: qty 30, 30d supply, fill #1

## 2023-03-11 ENCOUNTER — Other Ambulatory Visit (HOSPITAL_BASED_OUTPATIENT_CLINIC_OR_DEPARTMENT_OTHER): Payer: Self-pay

## 2023-04-26 ENCOUNTER — Other Ambulatory Visit (HOSPITAL_BASED_OUTPATIENT_CLINIC_OR_DEPARTMENT_OTHER): Payer: Self-pay

## 2023-04-26 ENCOUNTER — Other Ambulatory Visit: Payer: Self-pay

## 2023-04-26 MED ORDER — VENLAFAXINE HCL ER 75 MG PO CP24
75.0000 mg | ORAL_CAPSULE | Freq: Every day | ORAL | 1 refills | Status: DC
Start: 2023-04-26 — End: 2023-09-14
  Filled 2023-04-26: qty 90, 90d supply, fill #0
  Filled 2023-07-25: qty 90, 90d supply, fill #1

## 2023-04-26 MED ORDER — ACYCLOVIR 400 MG PO TABS
400.0000 mg | ORAL_TABLET | Freq: Every day | ORAL | 1 refills | Status: DC
  Filled 2023-04-26: qty 90, 90d supply, fill #0
  Filled 2023-07-26: qty 90, 90d supply, fill #1

## 2023-04-26 MED ORDER — LOSARTAN POTASSIUM 100 MG PO TABS
100.0000 mg | ORAL_TABLET | Freq: Every day | ORAL | 0 refills | Status: DC
Start: 2023-04-26 — End: 2023-07-25
  Filled 2023-04-26: qty 90, 90d supply, fill #0

## 2023-04-26 MED ORDER — OMEPRAZOLE 20 MG PO CPDR
20.0000 mg | DELAYED_RELEASE_CAPSULE | Freq: Every day | ORAL | 1 refills | Status: DC
  Filled 2023-04-26: qty 90, 90d supply, fill #0
  Filled 2023-07-26: qty 90, 90d supply, fill #1

## 2023-04-26 MED ORDER — AMLODIPINE BESYLATE 5 MG PO TABS
5.0000 mg | ORAL_TABLET | Freq: Every day | ORAL | 0 refills | Status: DC
  Filled 2023-04-26: qty 90, 90d supply, fill #0

## 2023-04-26 MED ORDER — LEVOTHYROXINE SODIUM 50 MCG PO TABS
50.0000 ug | ORAL_TABLET | Freq: Every day | ORAL | 1 refills | Status: DC
  Filled 2023-04-26: qty 90, 90d supply, fill #0
  Filled 2023-07-25: qty 90, 90d supply, fill #1

## 2023-05-24 ENCOUNTER — Other Ambulatory Visit (HOSPITAL_BASED_OUTPATIENT_CLINIC_OR_DEPARTMENT_OTHER): Payer: Self-pay

## 2023-06-24 ENCOUNTER — Other Ambulatory Visit (HOSPITAL_BASED_OUTPATIENT_CLINIC_OR_DEPARTMENT_OTHER): Payer: Self-pay

## 2023-07-18 DIAGNOSIS — M1712 Unilateral primary osteoarthritis, left knee: Secondary | ICD-10-CM | POA: Diagnosis not present

## 2023-07-25 ENCOUNTER — Other Ambulatory Visit (HOSPITAL_BASED_OUTPATIENT_CLINIC_OR_DEPARTMENT_OTHER): Payer: Self-pay

## 2023-07-25 ENCOUNTER — Other Ambulatory Visit: Payer: Self-pay

## 2023-07-25 MED ORDER — LOSARTAN POTASSIUM 100 MG PO TABS
100.0000 mg | ORAL_TABLET | Freq: Every day | ORAL | 0 refills | Status: DC
  Filled 2023-07-25: qty 90, 90d supply, fill #0

## 2023-07-25 MED ORDER — HYDROCHLOROTHIAZIDE 25 MG PO TABS
25.0000 mg | ORAL_TABLET | Freq: Every day | ORAL | 0 refills | Status: DC
  Filled 2023-07-25: qty 90, 90d supply, fill #0

## 2023-07-26 ENCOUNTER — Other Ambulatory Visit (HOSPITAL_BASED_OUTPATIENT_CLINIC_OR_DEPARTMENT_OTHER): Payer: Self-pay

## 2023-07-26 ENCOUNTER — Other Ambulatory Visit: Payer: Self-pay

## 2023-07-26 MED ORDER — DICLOFENAC SODIUM 75 MG PO TBEC
75.0000 mg | DELAYED_RELEASE_TABLET | Freq: Every day | ORAL | 1 refills | Status: AC | PRN
  Filled 2023-07-26: qty 30, 30d supply, fill #0
  Filled 2024-01-10: qty 30, 30d supply, fill #1

## 2023-07-26 MED ORDER — AMLODIPINE BESYLATE 5 MG PO TABS
5.0000 mg | ORAL_TABLET | Freq: Every day | ORAL | 0 refills | Status: DC
  Filled 2023-07-26: qty 90, 90d supply, fill #0

## 2023-07-27 ENCOUNTER — Other Ambulatory Visit (HOSPITAL_BASED_OUTPATIENT_CLINIC_OR_DEPARTMENT_OTHER): Payer: Self-pay

## 2023-09-09 ENCOUNTER — Telehealth: Admitting: Family Medicine

## 2023-09-09 DIAGNOSIS — M545 Low back pain, unspecified: Secondary | ICD-10-CM

## 2023-09-09 MED ORDER — NAPROXEN 500 MG PO TABS: 500.0000 mg | ORAL_TABLET | Freq: Two times a day (BID) | ORAL | 0 refills | Status: DC

## 2023-09-09 MED ORDER — BACLOFEN 10 MG PO TABS: 10.0000 mg | ORAL_TABLET | Freq: Three times a day (TID) | ORAL | 0 refills | Status: DC

## 2023-09-09 NOTE — Progress Notes (Signed)
 E-Visit for Back Pain   We are sorry that you are not feeling well.  Here is how we plan to help!  Based on what you have shared with me it looks like you mostly have acute back pain.  Acute back pain is defined as musculoskeletal pain that can resolve in 1-3 weeks with conservative treatment.  I have prescribed Naprosyn 500 mg take one by mouth twice a day non-steroid anti-inflammatory (NSAID) as well as Baclofen 10 mg every eight hours as needed which is a muscle relaxer  Some patients experience stomach irritation or in increased heartburn with anti-inflammatory drugs.  Please keep in mind that muscle relaxer's can cause fatigue and should not be taken while at work or driving.  Back pain is very common.  The pain often gets better over time.  The cause of back pain is usually not dangerous.  Most people can learn to manage their back pain on their own.  Home Care Stay active.  Start with short walks on flat ground if you can.  Try to walk farther each day. Do not sit, drive or stand in one place for more than 30 minutes.  Do not stay in bed. Do not avoid exercise or work.  Activity can help your back heal faster. Be careful when you bend or lift an object.  Bend at your knees, keep the object close to you, and do not twist. Sleep on a firm mattress.  Lie on your side, and bend your knees.  If you lie on your back, put a pillow under your knees. Only take medicines as told by your doctor. Put ice on the injured area. Put ice in a plastic bag Place a towel between your skin and the bag Leave the ice on for 15-20 minutes, 3-4 times a day for the first 2-3 days. 210 After that, you can switch between ice and heat packs. Ask your doctor about back exercises or massage. Avoid feeling anxious or stressed.  Find good ways to deal with stress, such as exercise.  Get Help Right Way If: Your pain does not go away with rest or medicine. Your pain does not go away in 1 week. You have new  problems. You do not feel well. The pain spreads into your legs. You cannot control when you poop (bowel movement) or pee (urinate) You feel sick to your stomach (nauseous) or throw up (vomit) You have belly (abdominal) pain. You feel like you may pass out (faint). If you develop a fever.  Make Sure you: Understand these instructions. Will watch your condition Will get help right away if you are not doing well or get worse.  Your e-visit answers were reviewed by a board certified advanced clinical practitioner to complete your personal care plan.  Depending on the condition, your plan could have included both over the counter or prescription medications.  If there is a problem please reply  once you have received a response from your provider.  Your safety is important to [place].  If you have drug allergies check your prescription carefully.    You can use MyChart to ask questions about today's visit, request a non-urgent call back, or ask for a work or school excuse for 24 hours related to this e-Visit. If it has been greater than 24 hours you will need to follow up with your provider, or enter a new e-Visit to address those concerns.  You will get an e-mail in the next two days asking about  your experience.  I hope that your e-visit has been valuable and will speed your recovery. Thank you for using e-visits.   have provided 5 minutes of non face to face time during this encounter for chart review and documentation.

## 2023-09-13 DIAGNOSIS — F4321 Adjustment disorder with depressed mood: Secondary | ICD-10-CM | POA: Diagnosis not present

## 2023-09-13 DIAGNOSIS — Z6834 Body mass index (BMI) 34.0-34.9, adult: Secondary | ICD-10-CM | POA: Diagnosis not present

## 2023-09-13 DIAGNOSIS — K219 Gastro-esophageal reflux disease without esophagitis: Secondary | ICD-10-CM | POA: Diagnosis not present

## 2023-09-13 DIAGNOSIS — I10 Essential (primary) hypertension: Secondary | ICD-10-CM | POA: Diagnosis not present

## 2023-09-13 DIAGNOSIS — H6991 Unspecified Eustachian tube disorder, right ear: Secondary | ICD-10-CM | POA: Diagnosis not present

## 2023-09-13 DIAGNOSIS — M1712 Unilateral primary osteoarthritis, left knee: Secondary | ICD-10-CM | POA: Diagnosis not present

## 2023-09-13 DIAGNOSIS — B001 Herpesviral vesicular dermatitis: Secondary | ICD-10-CM | POA: Diagnosis not present

## 2023-09-13 DIAGNOSIS — H81311 Aural vertigo, right ear: Secondary | ICD-10-CM | POA: Diagnosis not present

## 2023-09-13 DIAGNOSIS — E039 Hypothyroidism, unspecified: Secondary | ICD-10-CM | POA: Diagnosis not present

## 2023-09-14 ENCOUNTER — Other Ambulatory Visit (HOSPITAL_BASED_OUTPATIENT_CLINIC_OR_DEPARTMENT_OTHER): Payer: Self-pay

## 2023-09-14 MED ORDER — AMLODIPINE BESYLATE 5 MG PO TABS
5.0000 mg | ORAL_TABLET | Freq: Every day | ORAL | 1 refills | Status: AC
  Filled 2023-09-14 – 2023-10-18 (×5): qty 90, 90d supply, fill #0
  Filled 2024-01-10: qty 90, 90d supply, fill #1

## 2023-09-14 MED ORDER — DICLOFENAC SODIUM 75 MG PO TBEC
75.0000 mg | DELAYED_RELEASE_TABLET | Freq: Every day | ORAL | 0 refills | Status: DC | PRN
  Filled 2023-09-14 (×2): qty 90, 90d supply, fill #0

## 2023-09-14 MED ORDER — OMEPRAZOLE 20 MG PO CPDR
20.0000 mg | DELAYED_RELEASE_CAPSULE | Freq: Every day | ORAL | 1 refills | Status: AC
  Filled 2023-09-14 – 2023-10-20 (×5): qty 90, 90d supply, fill #0
  Filled 2024-01-10: qty 90, 90d supply, fill #1

## 2023-09-14 MED ORDER — VENLAFAXINE HCL ER 75 MG PO CP24
75.0000 mg | ORAL_CAPSULE | Freq: Every day | ORAL | 1 refills | Status: AC
  Filled 2023-09-14 – 2023-10-18 (×5): qty 90, 90d supply, fill #0
  Filled 2024-01-10: qty 90, 90d supply, fill #1

## 2023-09-14 MED ORDER — HYDROCHLOROTHIAZIDE 25 MG PO TABS
25.0000 mg | ORAL_TABLET | Freq: Every day | ORAL | 1 refills | Status: AC
  Filled 2023-09-14 – 2023-10-18 (×5): qty 90, 90d supply, fill #0
  Filled 2024-01-10: qty 90, 90d supply, fill #1

## 2023-09-14 MED ORDER — TOPIRAMATE 25 MG PO TABS
25.0000 mg | ORAL_TABLET | Freq: Two times a day (BID) | ORAL | 1 refills | Status: AC
  Filled 2023-09-14: qty 180, 90d supply, fill #0
  Filled 2023-12-06 – 2024-01-02 (×3): qty 180, 90d supply, fill #1

## 2023-09-14 MED ORDER — LOSARTAN POTASSIUM 100 MG PO TABS
100.0000 mg | ORAL_TABLET | Freq: Every day | ORAL | 1 refills | Status: AC
  Filled 2023-09-14 – 2023-10-18 (×5): qty 90, 90d supply, fill #0
  Filled 2024-01-10: qty 90, 90d supply, fill #1

## 2023-09-14 MED ORDER — ACYCLOVIR 400 MG PO TABS
400.0000 mg | ORAL_TABLET | Freq: Every day | ORAL | 1 refills | Status: AC
  Filled 2023-09-14 – 2023-10-20 (×5): qty 90, 90d supply, fill #0
  Filled 2024-01-10: qty 90, 90d supply, fill #1

## 2023-09-14 MED ORDER — AZELASTINE HCL 0.1 % NA SOLN
2.0000 | Freq: Every day | NASAL | 3 refills | Status: DC
  Filled 2023-09-14: qty 30, 30d supply, fill #0
  Filled 2023-10-07 – 2023-10-21 (×3): qty 30, 30d supply, fill #1
  Filled 2023-11-16: qty 30, 30d supply, fill #2
  Filled 2023-12-16 – 2023-12-27 (×2): qty 30, 30d supply, fill #3

## 2023-09-14 MED ORDER — LEVOTHYROXINE SODIUM 50 MCG PO TABS
50.0000 ug | ORAL_TABLET | Freq: Every day | ORAL | 1 refills | Status: AC
  Filled 2023-09-14 – 2023-10-18 (×5): qty 90, 90d supply, fill #0
  Filled 2024-01-10 (×2): qty 90, 90d supply, fill #1

## 2023-10-07 ENCOUNTER — Other Ambulatory Visit (HOSPITAL_BASED_OUTPATIENT_CLINIC_OR_DEPARTMENT_OTHER): Payer: Self-pay

## 2023-10-18 ENCOUNTER — Other Ambulatory Visit (HOSPITAL_BASED_OUTPATIENT_CLINIC_OR_DEPARTMENT_OTHER): Payer: Self-pay

## 2023-10-19 ENCOUNTER — Other Ambulatory Visit: Payer: Self-pay

## 2023-10-20 ENCOUNTER — Other Ambulatory Visit (HOSPITAL_BASED_OUTPATIENT_CLINIC_OR_DEPARTMENT_OTHER): Payer: Self-pay

## 2023-11-02 DIAGNOSIS — R92323 Mammographic fibroglandular density, bilateral breasts: Secondary | ICD-10-CM | POA: Diagnosis not present

## 2023-11-02 DIAGNOSIS — Z1231 Encounter for screening mammogram for malignant neoplasm of breast: Secondary | ICD-10-CM | POA: Diagnosis not present

## 2023-12-16 ENCOUNTER — Other Ambulatory Visit (HOSPITAL_BASED_OUTPATIENT_CLINIC_OR_DEPARTMENT_OTHER): Payer: Self-pay

## 2023-12-26 ENCOUNTER — Other Ambulatory Visit (HOSPITAL_BASED_OUTPATIENT_CLINIC_OR_DEPARTMENT_OTHER): Payer: Self-pay

## 2023-12-27 ENCOUNTER — Other Ambulatory Visit: Payer: Self-pay

## 2023-12-30 ENCOUNTER — Other Ambulatory Visit (HOSPITAL_BASED_OUTPATIENT_CLINIC_OR_DEPARTMENT_OTHER): Payer: Self-pay

## 2024-01-10 ENCOUNTER — Other Ambulatory Visit: Payer: Self-pay

## 2024-01-10 ENCOUNTER — Other Ambulatory Visit (HOSPITAL_BASED_OUTPATIENT_CLINIC_OR_DEPARTMENT_OTHER): Payer: Self-pay

## 2024-01-25 ENCOUNTER — Other Ambulatory Visit (HOSPITAL_BASED_OUTPATIENT_CLINIC_OR_DEPARTMENT_OTHER): Payer: Self-pay

## 2024-01-25 MED ORDER — AZELASTINE HCL 0.1 % NA SOLN
2.0000 | Freq: Every day | NASAL | 0 refills | Status: AC
  Filled 2024-01-25: qty 30, 30d supply, fill #0
  Filled 2024-02-13: qty 30, 60d supply, fill #0

## 2024-02-10 ENCOUNTER — Other Ambulatory Visit (HOSPITAL_BASED_OUTPATIENT_CLINIC_OR_DEPARTMENT_OTHER): Payer: Self-pay

## 2024-02-13 ENCOUNTER — Other Ambulatory Visit (HOSPITAL_BASED_OUTPATIENT_CLINIC_OR_DEPARTMENT_OTHER): Payer: Self-pay

## 2024-02-23 ENCOUNTER — Other Ambulatory Visit (HOSPITAL_BASED_OUTPATIENT_CLINIC_OR_DEPARTMENT_OTHER): Payer: Self-pay

## 2024-02-27 ENCOUNTER — Other Ambulatory Visit (HOSPITAL_BASED_OUTPATIENT_CLINIC_OR_DEPARTMENT_OTHER): Payer: Self-pay

## 2024-03-21 ENCOUNTER — Ambulatory Visit (HOSPITAL_BASED_OUTPATIENT_CLINIC_OR_DEPARTMENT_OTHER): Admitting: Family Medicine
